# Patient Record
Sex: Male | Born: 1950 | Race: White | Hispanic: No | Marital: Married | Smoking: Never smoker
Health system: Southern US, Community
[De-identification: ages and names within clinical notes are randomized; demographics above are authoritative.]

## PROBLEM LIST (undated history)

## (undated) DIAGNOSIS — N529 Male erectile dysfunction, unspecified: Secondary | ICD-10-CM

## (undated) DIAGNOSIS — M1990 Unspecified osteoarthritis, unspecified site: Secondary | ICD-10-CM

## (undated) DIAGNOSIS — L719 Rosacea, unspecified: Secondary | ICD-10-CM

## (undated) DIAGNOSIS — M4802 Spinal stenosis, cervical region: Secondary | ICD-10-CM

## (undated) DIAGNOSIS — R3129 Other microscopic hematuria: Secondary | ICD-10-CM

## (undated) DIAGNOSIS — K469 Unspecified abdominal hernia without obstruction or gangrene: Secondary | ICD-10-CM

## (undated) DIAGNOSIS — E785 Hyperlipidemia, unspecified: Secondary | ICD-10-CM

## (undated) DIAGNOSIS — R9431 Abnormal electrocardiogram [ECG] [EKG]: Secondary | ICD-10-CM

## (undated) HISTORY — DX: Spinal stenosis, cervical region: M48.02

## (undated) HISTORY — DX: Rosacea, unspecified: L71.9

## (undated) HISTORY — DX: Other microscopic hematuria: R31.29

## (undated) HISTORY — DX: Abnormal electrocardiogram (ECG) (EKG): R94.31

## (undated) HISTORY — DX: Unspecified osteoarthritis, unspecified site: M19.90

## (undated) HISTORY — DX: Unspecified abdominal hernia without obstruction or gangrene: K46.9

## (undated) HISTORY — DX: Male erectile dysfunction, unspecified: N52.9

## (undated) HISTORY — PX: HERNIA REPAIR: SHX51

## (undated) HISTORY — DX: Hyperlipidemia, unspecified: E78.5

## (undated) HISTORY — PX: TONSILLECTOMY: SUR1361

---

## 1998-06-28 ENCOUNTER — Emergency Department (HOSPITAL_COMMUNITY): Admission: EM | Admit: 1998-06-28 | Discharge: 1998-06-28 | Payer: Self-pay | Admitting: Emergency Medicine

## 2004-06-29 ENCOUNTER — Encounter: Admission: RE | Admit: 2004-06-29 | Discharge: 2004-06-29 | Payer: Self-pay | Admitting: General Surgery

## 2004-07-01 ENCOUNTER — Ambulatory Visit (HOSPITAL_BASED_OUTPATIENT_CLINIC_OR_DEPARTMENT_OTHER): Admission: RE | Admit: 2004-07-01 | Discharge: 2004-07-01 | Payer: Self-pay | Admitting: General Surgery

## 2004-07-01 ENCOUNTER — Ambulatory Visit (HOSPITAL_COMMUNITY): Admission: RE | Admit: 2004-07-01 | Discharge: 2004-07-01 | Payer: Self-pay | Admitting: General Surgery

## 2004-07-02 HISTORY — PX: OTHER SURGICAL HISTORY: SHX169

## 2005-02-22 ENCOUNTER — Emergency Department (HOSPITAL_COMMUNITY): Admission: EM | Admit: 2005-02-22 | Discharge: 2005-02-22 | Payer: Self-pay | Admitting: Emergency Medicine

## 2008-06-04 ENCOUNTER — Encounter: Admission: RE | Admit: 2008-06-04 | Discharge: 2008-06-04 | Payer: Self-pay | Admitting: Family Medicine

## 2008-06-06 ENCOUNTER — Encounter: Admission: RE | Admit: 2008-06-06 | Discharge: 2008-06-06 | Payer: Self-pay | Admitting: Family Medicine

## 2009-02-08 HISTORY — PX: CERVICAL DISC SURGERY: SHX588

## 2009-02-21 ENCOUNTER — Ambulatory Visit (HOSPITAL_COMMUNITY): Admission: RE | Admit: 2009-02-21 | Discharge: 2009-02-21 | Payer: Self-pay | Admitting: Neurological Surgery

## 2009-02-21 ENCOUNTER — Encounter (INDEPENDENT_AMBULATORY_CARE_PROVIDER_SITE_OTHER): Payer: Self-pay | Admitting: Neurological Surgery

## 2009-11-24 ENCOUNTER — Ambulatory Visit (HOSPITAL_BASED_OUTPATIENT_CLINIC_OR_DEPARTMENT_OTHER): Admission: RE | Admit: 2009-11-24 | Discharge: 2009-11-24 | Payer: Self-pay | Admitting: General Surgery

## 2009-11-24 HISTORY — PX: OTHER SURGICAL HISTORY: SHX169

## 2010-04-26 LAB — CBC
HCT: 41.2 % (ref 39.0–52.0)
Hemoglobin: 14.7 g/dL (ref 13.0–17.0)
MCHC: 35.8 g/dL (ref 30.0–36.0)
MCV: 92.5 fL (ref 78.0–100.0)

## 2010-04-26 LAB — DIFFERENTIAL
Lymphocytes Relative: 26 % (ref 12–46)
Lymphs Abs: 1.3 10*3/uL (ref 0.7–4.0)
Monocytes Absolute: 0.4 10*3/uL (ref 0.1–1.0)
Monocytes Relative: 8 % (ref 3–12)

## 2010-04-26 LAB — BASIC METABOLIC PANEL
CO2: 28 mEq/L (ref 19–32)
Calcium: 9.1 mg/dL (ref 8.4–10.5)
Chloride: 103 mEq/L (ref 96–112)
Creatinine, Ser: 0.94 mg/dL (ref 0.4–1.5)
Glucose, Bld: 85 mg/dL (ref 70–99)
Sodium: 139 mEq/L (ref 135–145)

## 2010-04-26 LAB — PROTIME-INR: INR: 1.06 (ref 0.00–1.49)

## 2010-06-26 NOTE — Op Note (Signed)
NAME:  Jeffrey Browning, Jeffrey Browning                ACCOUNT NO.:  [id number]   MEDICAL RECORD NO.:  [id number]          PATIENT TYPE:  AMB   LOCATION:  DSC                          FACILITY:  [facility]   PHYSICIAN:  Leonie Man, M.D.   DATE OF BIRTH:  [birth year]   DATE OF PROCEDURE:  07/01/2004  DATE OF DISCHARGE:                                 OPERATIVE REPORT   PREOPERATIVE DIAGNOSIS:  Left inguinal hernia.   POSTOPERATIVE DIAGNOSIS:  Left direct inguinal hernia.   PROCEDURE:  Left inguinal herniorrhaphy with mesh (Parietex).   SURGEON:  Leonie Man, M.D.   ASSISTANT:  OR tech.   ANESTHESIA:  General.   SPECIMENS:  No specimens were sent to pathology.   ESTIMATED BLOOD LOSS:  Minimal.   COMPLICATIONS:  There were no complications and the patient was returned to  the PACU in excellent condition.   NOTE:  The patient is a 60 year old gentleman with a known left inguinal  hernia for about 8 or 9 years.  This has been growing rather large in the  last few months extending down into the left scrotum.  The hernia has  multiple loops of small bowel prolapsed into it.  This reduces fairly  easily. He comes to the operating room now after the risks and potential  benefits of surgery been fully discussed. All questions answered and consent  obtained.   PROCEDURE:  Following induction of satisfactory general anesthesia, the  patient positioned supinely. The abdomen was prepped and draped to be  included in a sterile operative field.  A left-sided groin incision is  carried down through skin, subcutaneous tissues through the external oblique  chondrosis with protection of the ilioinguinal nerve.  The very large  spermatic cord is then elevated and held with a Penrose drain. The very  large hernial sac is dissected free from the cord contents and this sac is  carried up to its origin which turned out to be medial to the inferior  epigastric vessels, making this a very large and prolapsing direct  inguinal  hernia. The entire hernia sac and its contents were prolapsed back into the  retroperitoneal space and the transversalis defect was closed with a onlay  patch of Parietex mesh which was sewn in at the pubic tubercle, carried up  along the conjoint tendon to the internal ring and again from the pubic  tubercle up along the shelving edge of Poupart's ligament to the internal  ring. The mesh was split so as to allow emanation of the cord. The tails of  the mesh was then sutured down behind the cord at the internal ring. All  areas of dissection then checked for hemostasis, noted to be dry. Sponge and  instrument counts were doubly verified and the wounds closed in layers as  follows. The external oblique chondrosis closed with  running 2-0 Vicryl suture. The Scarpa fascia closed with running 3-0 Vicryl  suture and skin closed with running 4-0 Monocryl suture.  It was then  reinforced with Steri-Strips. Sterile dressings applied. The anesthetic  reversed and the patient removed from the operating  room to the recovery  room in stable condition. He tolerated the procedure well.      PB/MEDQ  D:  07/01/2004  T:  07/01/2004  Job:  [id number]   cc:   Maryla Morrow. Modesto Charon, M.D.  [address]  [place]  [place] [address]  Fax: [phone]

## 2010-10-20 ENCOUNTER — Encounter (INDEPENDENT_AMBULATORY_CARE_PROVIDER_SITE_OTHER): Payer: Self-pay | Admitting: General Surgery

## 2010-10-21 ENCOUNTER — Encounter (INDEPENDENT_AMBULATORY_CARE_PROVIDER_SITE_OTHER): Payer: Self-pay | Admitting: General Surgery

## 2010-10-22 ENCOUNTER — Telehealth (INDEPENDENT_AMBULATORY_CARE_PROVIDER_SITE_OTHER): Payer: Self-pay | Admitting: General Surgery

## 2010-10-22 NOTE — Telephone Encounter (Signed)
Appointment moved up- will give message for Dr. Zachery Dakins to review. RMP

## 2010-10-30 ENCOUNTER — Encounter (INDEPENDENT_AMBULATORY_CARE_PROVIDER_SITE_OTHER): Payer: Self-pay | Admitting: General Surgery

## 2010-11-02 ENCOUNTER — Encounter (INDEPENDENT_AMBULATORY_CARE_PROVIDER_SITE_OTHER): Payer: Self-pay | Admitting: General Surgery

## 2010-11-02 ENCOUNTER — Ambulatory Visit (INDEPENDENT_AMBULATORY_CARE_PROVIDER_SITE_OTHER): Payer: Self-pay | Admitting: General Surgery

## 2010-11-02 VITALS — BP 122/76 | HR 80 | Temp 98.1°F | Resp 18 | Ht 72.0 in | Wt 217.4 lb

## 2010-11-02 DIAGNOSIS — R109 Unspecified abdominal pain: Secondary | ICD-10-CM

## 2010-11-02 DIAGNOSIS — R1030 Lower abdominal pain, unspecified: Secondary | ICD-10-CM

## 2010-11-02 NOTE — Patient Instructions (Signed)
Returned stool Hemoccult cards and try to see if there is any way that she can get included an insurance coverage. If you have no further episodes of pain and passage of a kidney stone is a good possibility. If you have an episode of pain at a time that I could examine you I am more likely to be able to identify the cause of your pain

## 2010-11-02 NOTE — Progress Notes (Signed)
Subjective:     Patient ID: Jeffrey Browning, male   DOB: [birth year], 60 y.o.   MRN: [id number]  HPIThe patient is a 60 year old male who approximately one year ago I repaired a right inguinal hernia with mesh an approximately 5 years ago he had a large left inguinal repair by Dr. Lurene Shadow. Recently he's had intermittent episodes of right groin pain and he describes as severe pain comes on all sudden last from 1-1/2 hours to 2-3 hours and then the pain is just go away.  He reported this to his PA Dr. Elvera Lennox  with the [facility] physician. He recommended that the patient see [place] thinking and he was having some scar tissue or possibly related to his hernia repair. The patient states that when these had these episodes of pain one time he could feel a definite ridge in the right incision area but he doesn't really describe a mass. The patient says the pain seems similar to when he had an episode of bowel obstruction before Dr. Lurene Shadow repaired the left inguinal hernia, but  this pain is on the right. The patient has had previous blood in his urine saw a urologist had a CT this was probably 4 years ago. They cystoscoped him but they never find an etiology for the blood at that time. He was not having these episodes of pain then. The pain that he describes could be a  right kidney stone and traveling toward his bladder.The pain is not related to any particular activities last from 2-3 hours to about 4 hours and then just goes away.  He has not noticed any blood in stool or abdominal distention or change in bowel habits. A urine check currently at [facility]  described a trace amount of blood in his urine. Dr. Janace Litten does not describe any tenderness or masses on his abdominal or groin exam when he saw the patient back in August.  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 His wife works for the state that he is not eligible  to be on her policies sense some dead line was passed.  The patient states he has no pain now and then last episode of pain was approximately 3 weeks ago.   Review of Systems Current Outpatient Prescriptions  Medication Sig Dispense Refill  . aspirin 81 MG tablet Take 81 mg by mouth daily.        Marland Kitchen doxycycline (ORACEA) 40 MG capsule Take 40 mg by mouth every morning.        . nabumetone (RELAFEN) 750 MG tablet Take 750 mg by mouth 2 (two) times daily.        . pravastatin (PRAVACHOL) 40 MG tablet Take 40 mg by mouth daily.        . sildenafil (VIAGRA) 100 MG tablet Take by mouth daily as needed.         Past Surgical History  Procedure Date  . Left ing hernia 07/02/2004    mesh (parietx)  . Right ing hernia 11/24/2009    mesh  . Tonsillectomy   . Hernia repair fall 2011  . Cervical disc surgery january 2011    repair   No Known Allergies Last colonoscopy approximately 4 years ago     Objective:   Physical ExamBP 122/76  Pulse 80  Temp 98.1 F (36.7 C)  Resp 18  Ht 6' (1.829 m)  [employer]  217 lb 6 oz (98.601 kg)  BMI 29.48 kg/m2  On physical examination at the the patient is in no discomfort and I examined his abdomen both groin and also a rectal examination ultrasound both the left and right and cannot appreciate a recurrent hernia on left or right still was very little stool in his rectum and the mucosa is Hemoccult negative the patient states he does not have pain radiating into his testicle when he has the episodes of pain   Assessment:    Recurrent episodes of right groin pain possibly a recurrent hernia which I cannot appreciate on physical ultrasound exam that the pain is described as fairly typical for a right kidney stone that could be passed in towards his bladder. I think we should first do a home Hemoccult and make sure that there is no blood in his stool and then I think a CT of the abdomen and pelvis with most likely determine whether he does have a recurrent hernia  does have a kidney stone or just what is going on.        Plan:    Returned a stool Hemoccult cards and see whether we need to proceed with a CAT scan. The patient ask whether to give him in 3 months extension of off work and I did not feel that I can justify that specially not knowing the etiology of his pain.

## 2010-11-10 ENCOUNTER — Encounter (INDEPENDENT_AMBULATORY_CARE_PROVIDER_SITE_OTHER): Payer: Self-pay | Admitting: General Surgery

## 2010-11-10 ENCOUNTER — Ambulatory Visit (INDEPENDENT_AMBULATORY_CARE_PROVIDER_SITE_OTHER): Payer: Self-pay | Admitting: Surgery

## 2010-11-10 VITALS — BP 138/78 | HR 84 | Temp 97.0°F | Resp 16 | Ht 72.0 in | Wt 217.8 lb

## 2010-11-10 DIAGNOSIS — K4091 Unilateral inguinal hernia, without obstruction or gangrene, recurrent: Secondary | ICD-10-CM

## 2010-11-11 ENCOUNTER — Encounter (INDEPENDENT_AMBULATORY_CARE_PROVIDER_SITE_OTHER): Payer: Self-pay | Admitting: Surgery

## 2010-11-11 ENCOUNTER — Other Ambulatory Visit (INDEPENDENT_AMBULATORY_CARE_PROVIDER_SITE_OTHER): Payer: Self-pay | Admitting: General Surgery

## 2010-11-11 ENCOUNTER — Telehealth (INDEPENDENT_AMBULATORY_CARE_PROVIDER_SITE_OTHER): Payer: Self-pay

## 2010-11-11 DIAGNOSIS — K4091 Unilateral inguinal hernia, without obstruction or gangrene, recurrent: Secondary | ICD-10-CM | POA: Insufficient documentation

## 2010-11-11 NOTE — Patient Instructions (Signed)
See the Handout(s) we gave you.  Consider surgery.  Please call our office at [phone] if you wish to schedule surgery or if you have further questions / concerns.

## 2010-11-11 NOTE — Progress Notes (Signed)
Subjective:     Patient ID: Jeffrey Browning, male   DOB: Apr 24, 1950, 60 y.o.   MRN: [id number]  HPI  Patient Care Team: Lolita Patella as PCP - General (Family Medicine)  This patient is a 60 y.o.male who presents today for surgical evaluation.   Patient noted groin pain. He saw Dr. Zachery Dakins. Initially, a hernia could not be detected. However the pain returned and he felt a mass. Dr. Zachery Dakins detected a hernia this time. He requested that I assume care of the patient for probable laparoscopic repair. The patient had an open repair of a right hernia as a child. He had a redo repair by Dr. Zachery Dakins with mesh on that side. He had a separate repair on the left at a different time. No pain on the left side.  Daily bowel movements. Good physical activity. No difficulty with urinary retention or prostate problems. No history of prior MRSA nor other skin infections  Past Medical History  Diagnosis Date  . Stenosis, cervical spine     left side tingling  . ED (erectile dysfunction)   . Hematuria, microscopic   . Rosacea   . Abnormal EKG     cardio lite stress test 2006 =normal Dr. Fraser Din  . Hyperlipidemia   . Hernia   . Arthritis   . Abdominal pain     Past Surgical History  Procedure Date  . Left ing hernia 07/02/2004    mesh (parietx)  . Right ing hernia 11/24/2009    open w mesh. [employer]  . Tonsillectomy   . Hernia repair fall 2011  . Cervical disc surgery january 2011    repair    History   Social History  . Marital Status: Married    Spouse Name: N/A    Number of Children: N/A  . Years of Education: N/A   Occupational History  . Not on file.   Social History Main Topics  . Smoking status: Never Smoker   . Smokeless tobacco: Never Used  . Alcohol Use: Yes  . Drug Use: No  . Sexually Active:    Other Topics Concern  . Not on file   Social History Narrative  . No narrative on file    No family history on file.  Current outpatient prescriptions:aspirin  81 MG tablet, Take 81 mg by mouth daily.  , Disp: , Rfl: ;  doxycycline (ORACEA) 40 MG capsule, Take 40 mg by mouth every morning.  , Disp: , Rfl: ;  nabumetone (RELAFEN) 750 MG tablet, Take 750 mg by mouth 2 (two) times daily.  , Disp: , Rfl: ;  pravastatin (PRAVACHOL) 40 MG tablet, Take 40 mg by mouth daily.  , Disp: , Rfl:  sildenafil (VIAGRA) 100 MG tablet, Take by mouth daily as needed.  , Disp: , Rfl:   No Known Allergies     Review of Systems  Constitutional: Negative for fever, chills and diaphoresis.  HENT: Negative for nosebleeds, sore throat, facial swelling, mouth sores, trouble swallowing and ear discharge.   Eyes: Negative for photophobia, discharge and visual disturbance.  Respiratory: Negative for choking, chest tightness, shortness of breath and stridor.   Cardiovascular: Negative for chest pain and palpitations.  Gastrointestinal: Negative for nausea, vomiting, abdominal pain, diarrhea, constipation, blood in stool, abdominal distention, anal bleeding and rectal pain.  Genitourinary: Negative for dysuria, urgency, hematuria, discharge, penile swelling, scrotal swelling, difficulty urinating, penile pain and testicular pain.  Musculoskeletal: Negative for myalgias, back pain, arthralgias and gait problem.  Skin: Negative for color change, pallor, rash and wound.  Neurological: Negative for dizziness, speech difficulty, weakness, numbness and headaches.  Hematological: Negative for adenopathy. Does not bruise/bleed easily.  Psychiatric/Behavioral: Negative for hallucinations, confusion and agitation.       Objective:   Physical Exam  Constitutional: He is oriented to person, place, and time. He appears well-developed and well-nourished. No distress.  HENT:  Head: Normocephalic.  Mouth/Throat: Oropharynx is clear and moist. No oropharyngeal exudate.  Eyes: Conjunctivae and EOM are normal. Pupils are equal, round, and reactive to light. No scleral icterus.  Neck: Normal  range of motion. Neck supple. No tracheal deviation present.  Cardiovascular: Normal rate, regular rhythm and intact distal pulses.   Pulmonary/Chest: Effort normal and breath sounds normal. No respiratory distress.  Abdominal: Soft. He exhibits no distension and no mass. There is no tenderness. Hernia confirmed negative in the left inguinal area.  Genitourinary: Penis normal.    No penile tenderness.  Musculoskeletal: Normal range of motion. He exhibits no tenderness.  Lymphadenopathy:    He has no cervical adenopathy.       Right: No inguinal adenopathy present.       Left: No inguinal adenopathy present.  Neurological: He is alert and oriented to person, place, and time. No cranial nerve deficit. He exhibits normal muscle tone. Coordination normal.  Skin: Skin is warm and dry. No rash noted. He is not diaphoretic. No erythema. No pallor.  Psychiatric: He has a normal mood and affect. His behavior is normal. Judgment and thought content normal.       Assessment:     Second recurrence of a right inguinal hernia in an overweight male.   Plan:     I think it makes sense to approach the repair from a different angle. He would be a good candidate for a laparoscopic approach. He is very interested in this.  The anatomy & physiology of the abdominal wall was discussed.  The pathophysiology of hernias was discussed.  Natural history risks without surgery of enlargement, pain, incarceration & strangulation was discussed.   Contributors to complications such as smoking, obesity, diabetes, prior surgery, etc were discussed.  I feel the risks of no intervention will lead to serious problems that outweigh the operative risks; therefore, I recommended surgery to reduce and repair the hernia.  I explained laparoscopic techniques with possible need for an open approach.  I noted the probable use of mesh to patch and/or buttress hernia repair  Risks such as bleeding, infection, abscess, need for  further treatment, heart attack, death, and other risks were discussed.  Goals of post-operative recovery were discussed as well.  Possibility that this will not correct all symptoms was explained.  I stressed the importance of low-impact activity, aggressive pain control, avoiding constipation, & not pushing through pain to minimize risk of post-operative chronic pain or injury. Possibility of reherniation was discussed.  We will work to minimize complications.   An educational handout further explaining the pathology & treatment options was given as well.  Questions were answered.  The patient expresses understanding & wishes to proceed with surgery.

## 2010-11-11 NOTE — Telephone Encounter (Signed)
Hemoccult x3 negative for blood. Tested on 11/11/2010

## 2010-11-13 ENCOUNTER — Encounter (INDEPENDENT_AMBULATORY_CARE_PROVIDER_SITE_OTHER): Payer: Self-pay | Admitting: General Surgery

## 2011-01-07 IMAGING — CR DG CERVICAL SPINE COMPLETE 4+V
6 series · 6 of 6 positions shown · non-contrast
Comparison: None.

CLINICAL DATA: Chronic neck and left shoulder pain.  No recent
injuries.

CERVICAL SPINE - COMPLETE 4+ VIEW 06/04/2008:

[view not recorded (1 of 6)]
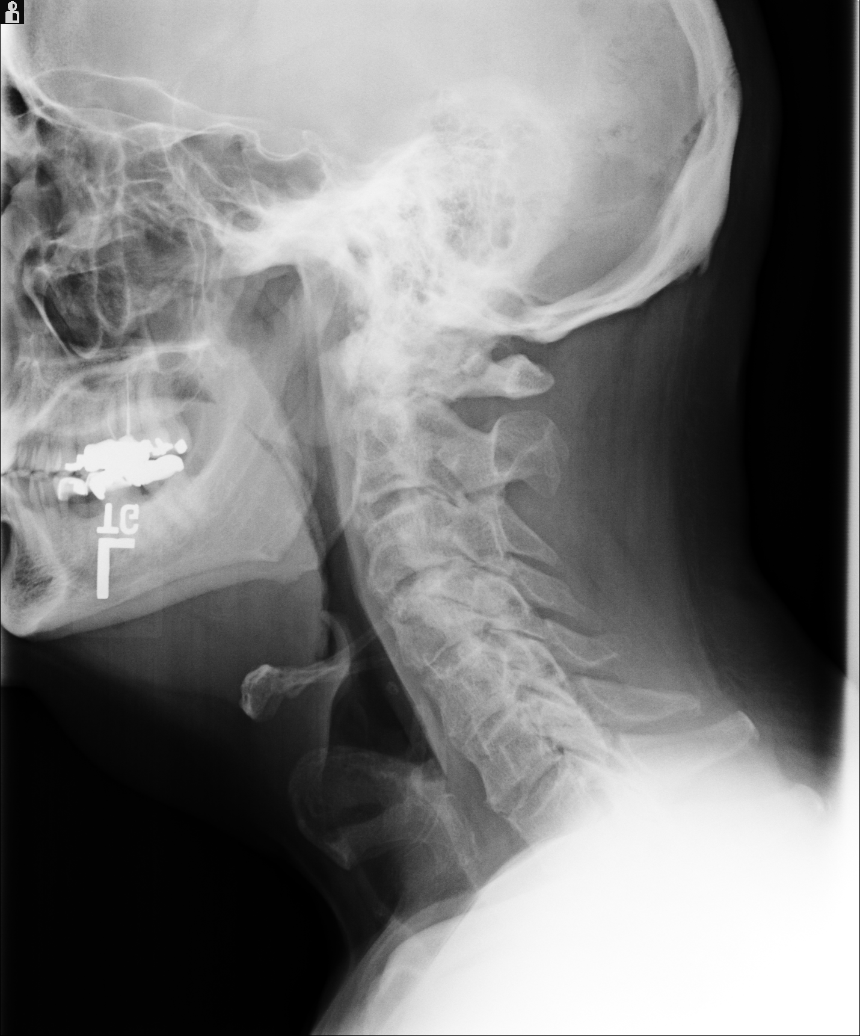

[view not recorded (2 of 6)]
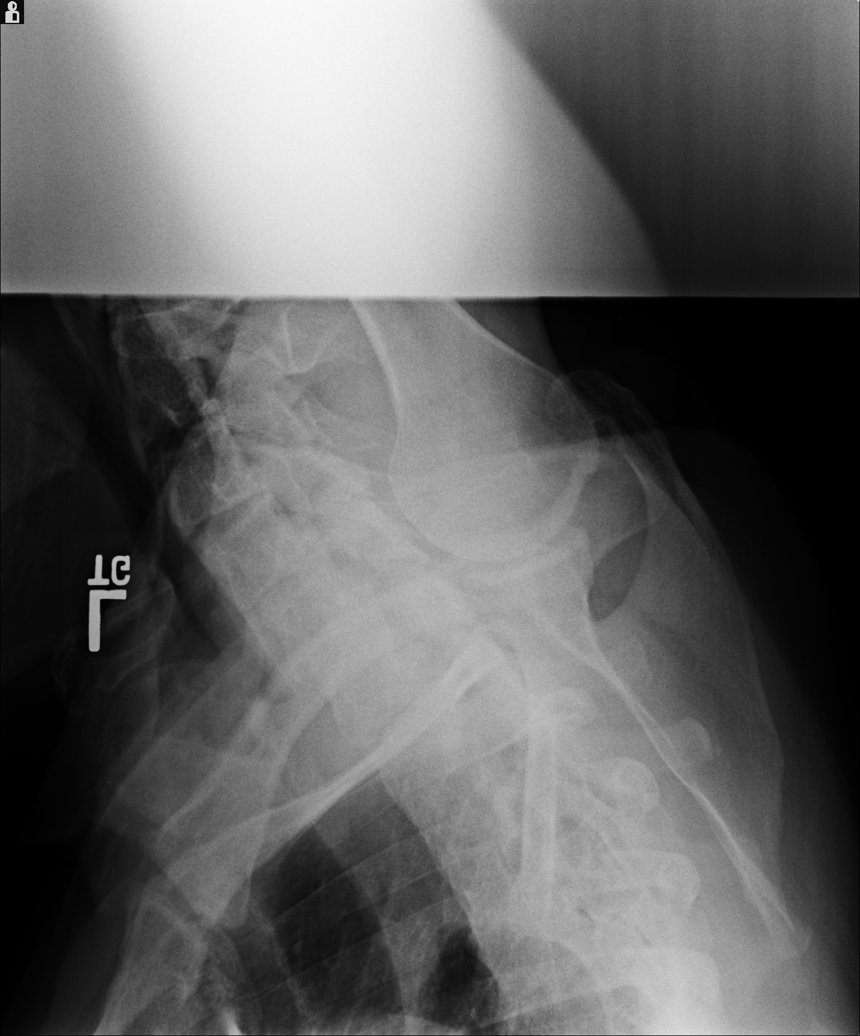

[view not recorded (3 of 6)]
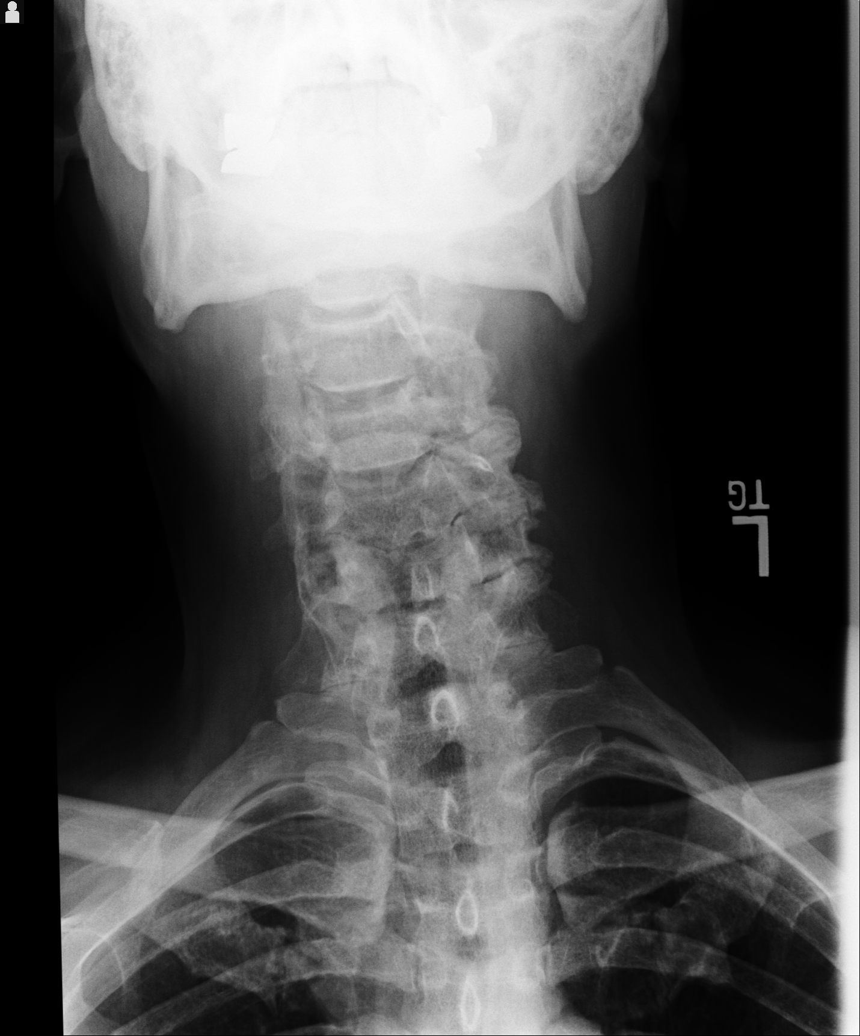

[view not recorded (4 of 6)]
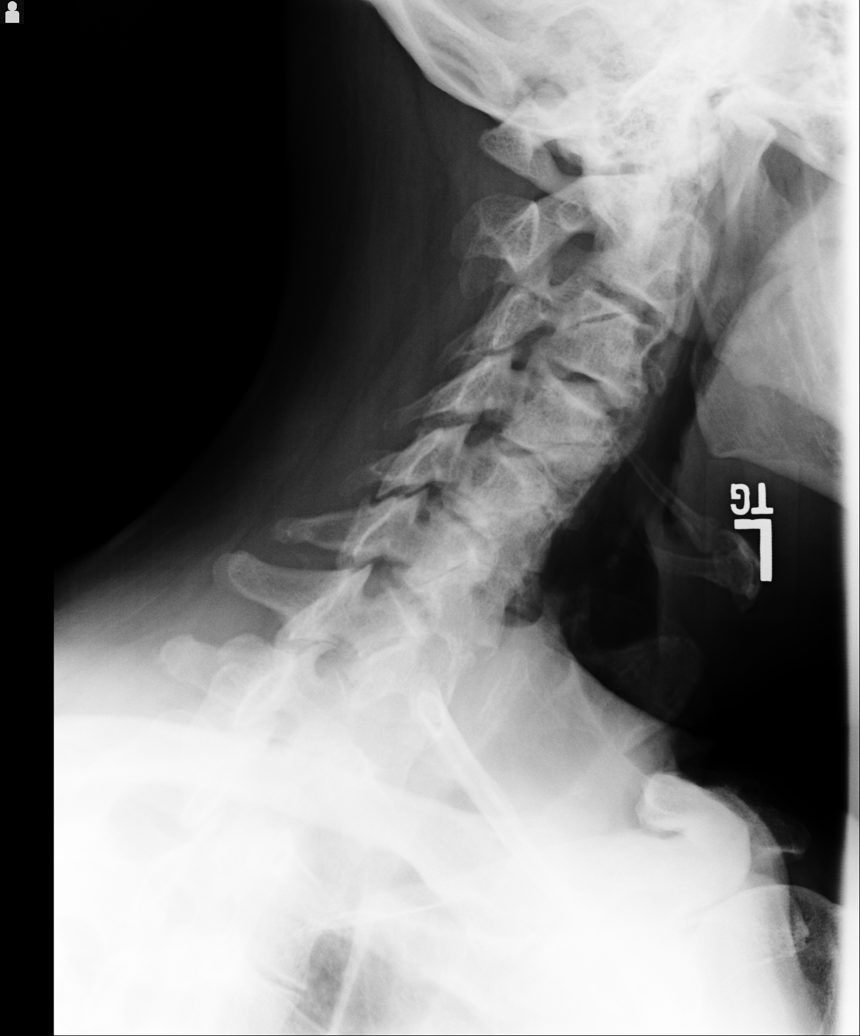

[view not recorded (5 of 6)]
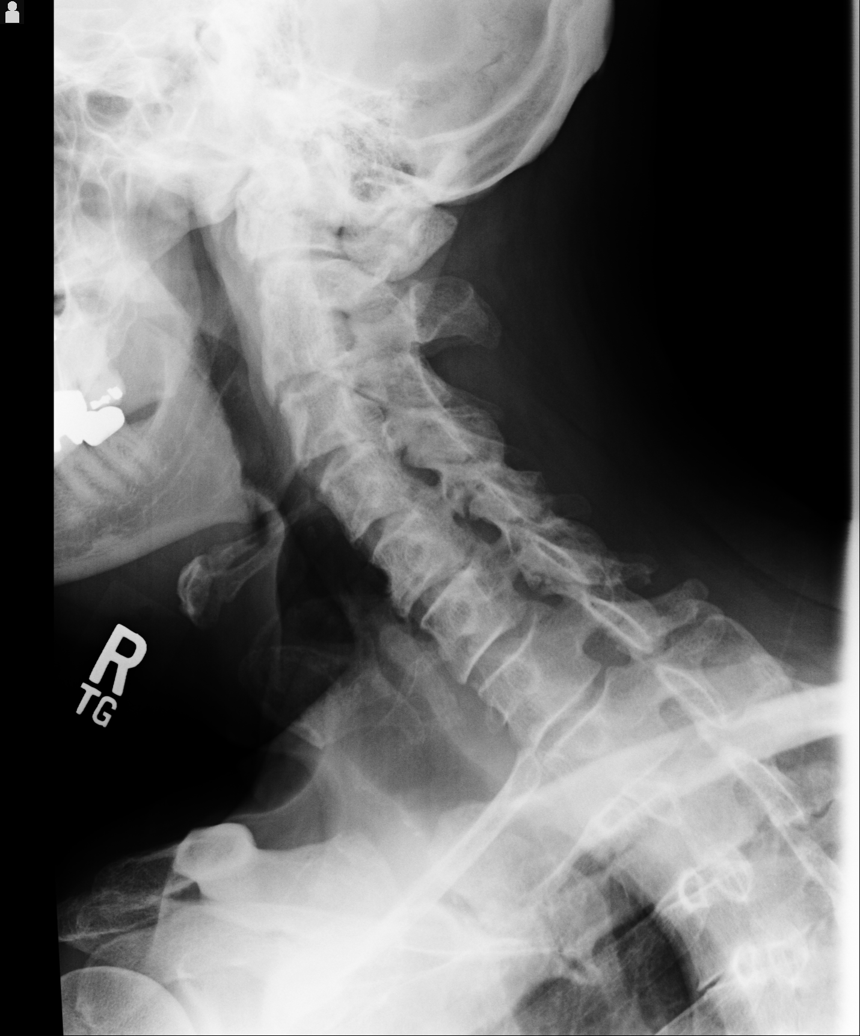

[view not recorded (6 of 6)]
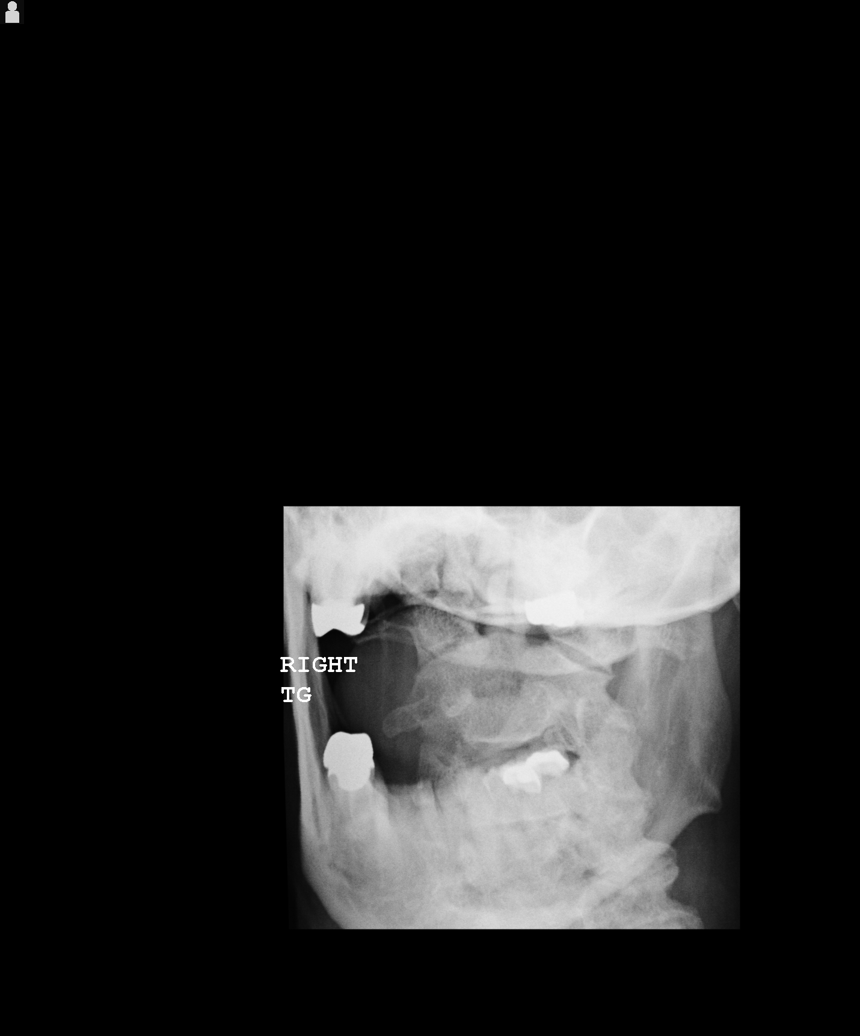

[6 of 6 positions shown; findings below may reference images not displayed]

FINDINGS: Anatomic alignment.  No visible fractures.  Disc space
narrowing and associated endplate hypertrophic changes at every
level from C3-4 through C6-7, worst at C5-6.  Diffuse facet
degenerative changes.  Normal prevertebral soft tissues.  Oblique
views demonstrate moderate to severe multilevel bilateral foraminal
stenoses.  No static evidence of instability.
IMPRESSION: Moderate to severe diffuse degenerative disc disease, spondylosis,
and facet degenerative changes with multilevel bilateral foraminal
stenoses.

## 2013-04-19 ENCOUNTER — Ambulatory Visit
Admission: RE | Admit: 2013-04-19 | Discharge: 2013-04-19 | Disposition: A | Discharge status: Home / Self Care | Payer: BC Managed Care – PPO | Source: Ambulatory Visit | Attending: Family Medicine | Admitting: Family Medicine

## 2013-04-19 ENCOUNTER — Other Ambulatory Visit: Payer: Self-pay | Admitting: Family Medicine

## 2013-04-19 DIAGNOSIS — M79661 Pain in right lower leg: Secondary | ICD-10-CM

## 2013-04-19 DIAGNOSIS — R609 Edema, unspecified: Secondary | ICD-10-CM

## 2014-02-18 ENCOUNTER — Ambulatory Visit (INDEPENDENT_AMBULATORY_CARE_PROVIDER_SITE_OTHER): Payer: Medicare Other

## 2014-02-18 VITALS — BP 134/84 | HR 88 | Resp 12

## 2014-02-18 DIAGNOSIS — M158 Other polyosteoarthritis: Secondary | ICD-10-CM

## 2014-02-18 DIAGNOSIS — L84 Corns and callosities: Secondary | ICD-10-CM | POA: Diagnosis not present

## 2014-02-18 DIAGNOSIS — M2040 Other hammer toe(s) (acquired), unspecified foot: Secondary | ICD-10-CM | POA: Diagnosis not present

## 2014-02-18 DIAGNOSIS — Q828 Other specified congenital malformations of skin: Secondary | ICD-10-CM | POA: Diagnosis not present

## 2014-02-18 NOTE — Patient Instructions (Signed)
Corns and Calluses Corns are small areas of thickened skin that usually occur on the top, sides, or tip of a toe. They contain a cone-shaped core with a point that can press on a nerve below. This causes pain. Calluses are areas of thickened skin that usually develop on hands, fingers, palms, soles of the feet, and heels. These are areas that experience frequent friction or pressure. CAUSES  Corns are usually the result of rubbing (friction) or pressure from shoes that are too tight or do not fit properly. Calluses are caused by repeated friction and pressure on the affected areas. SYMPTOMS  A hard growth on the skin.  Pain or tenderness under the skin.  Sometimes, redness and swelling.  Increased discomfort while wearing tight-fitting shoes. DIAGNOSIS  Your caregiver can usually tell what the problem is by doing a physical exam. TREATMENT  Removing the cause of the friction or pressure is usually the only treatment needed. However, sometimes medicines can be used to help soften the hardened, thickened areas. These medicines include salicylic acid plasters and 12% ammonium lactate lotion. These medicines should only be used under the direction of your caregiver. HOME CARE INSTRUCTIONS   Try to remove pressure from the affected area.  You may wear donut-shaped corn pads to protect your skin.  You may use a pumice stone or nonmetallic nail file to gently reduce the thickness of a corn.  Wear properly fitted footwear.  If you have calluses on the hands, wear gloves during activities that cause friction.  If you have diabetes, you should regularly examine your feet. Tell your caregiver if you notice any problems with your feet. SEEK IMMEDIATE MEDICAL CARE IF:   You have increased pain, swelling, redness, or warmth in the affected area.  Your corn or callus starts to drain fluid or bleeds.  You are not getting better, even with treatment. Document Released: 11/01/2003 Document  Revised: 04/19/2011 Document Reviewed: 09/22/2010 [facility] LLCExitCare Patient Information 2015 [employer], [place]. This information is not intended to replace advice given to you by your health care provider. Make sure you discuss any questions you have with your health care provider.    Because of the loss of fat pad good recommendation at this time would be gel insoles or memory foam type insoles to help provide and replace the cushion that has been lost on the bottom of the foot

## 2014-02-18 NOTE — Progress Notes (Signed)
   Subjective:    Patient ID: Jeffrey Browning, male    DOB: 03/21/1950, 64 y.o.   MRN: [id number]  HPI  PT STATED  STATED LT BALL OF THE FOOT HAVE CALLUS AND BEEN HURTING/BURNING FOR 25 YEARS. PT BEEN SEEN IN [place] FOR THE SAME PROBLEM. FOOT IS GETTING WORSE AND GET AGGRAVATED BY PRESSURE. TRIED TO TRIM IT DOWN AND INSERTS BUT NO HELP.   Review of Systems  Cardiovascular: Positive for leg swelling.  All other systems reviewed and are negative.      Objective:   Physical Exam 64 year old white male well-developed well-nourished oriented 3 presents this time with recurrence of keratosis plantar aspect left foot more so than right sub-second third and fourth metatarsals has multiple DP keratotic lesions been going on for the past 25 years. Objective findings vascular status is intact with pedal pulses palpable DP and PT +2 over 4 Refill time 3 seconds epicritic and proprioceptive sensations intact and symmetric bilateral normal plantar response DTRs not listed dermatologic the skin color pigment normal hair growth absent nails criptotic there is atrophy of fat pad plantarly with a keratotic lesion subsecond sub-third is most painful tender symptomatic into lesion sub-fourth and noted on left foot the single lesion sub-second on right is identified as well. Once limitus rigidus deformity bilateral with some arthropathy the forefoot there is some swelling in arthropathy the left ankle is well patient digit general atrophy of fat pad was rigid contractures of the toes are identified.       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 Plan at this time patient had been a candidate for surgery years ago which to avoid that at this time I do feel cushioned insoles and passively beneficial keratotic lesions debrided at this time there is no secondary  infection no ascending size lymphangitis recommended a memory foam or gel insoles follow-up as needed for future palliative care maintain a cushioned accommodative shoe at all times in the future surgical intervention might be considered just that is a last resort  Alvan Dameichard Zailyn Thoennes DPM

## 2017-12-19 DIAGNOSIS — Z01818 Encounter for other preprocedural examination: Secondary | ICD-10-CM

## 2020-06-04 DIAGNOSIS — Z125 Encounter for screening for malignant neoplasm of prostate: Secondary | ICD-10-CM | POA: Diagnosis not present

## 2020-06-04 DIAGNOSIS — E782 Mixed hyperlipidemia: Secondary | ICD-10-CM | POA: Diagnosis not present

## 2020-06-04 DIAGNOSIS — I868 Varicose veins of other specified sites: Secondary | ICD-10-CM | POA: Diagnosis not present

## 2020-06-04 DIAGNOSIS — Z1389 Encounter for screening for other disorder: Secondary | ICD-10-CM | POA: Diagnosis not present

## 2020-06-04 DIAGNOSIS — Z0000 Encounter for general adult medical examination without abnormal findings: Secondary | ICD-10-CM | POA: Diagnosis not present

## 2020-06-04 DIAGNOSIS — M545 Low back pain, unspecified: Secondary | ICD-10-CM | POA: Diagnosis not present

## 2020-06-04 DIAGNOSIS — L719 Rosacea, unspecified: Secondary | ICD-10-CM | POA: Diagnosis not present

## 2020-06-04 DIAGNOSIS — R609 Edema, unspecified: Secondary | ICD-10-CM | POA: Diagnosis not present

## 2020-06-04 DIAGNOSIS — N529 Male erectile dysfunction, unspecified: Secondary | ICD-10-CM | POA: Diagnosis not present

## 2021-06-16 DIAGNOSIS — L821 Other seborrheic keratosis: Secondary | ICD-10-CM | POA: Diagnosis not present

## 2021-06-16 DIAGNOSIS — L820 Inflamed seborrheic keratosis: Secondary | ICD-10-CM | POA: Diagnosis not present

## 2021-06-16 DIAGNOSIS — L578 Other skin changes due to chronic exposure to nonionizing radiation: Secondary | ICD-10-CM | POA: Diagnosis not present

## 2021-06-16 DIAGNOSIS — C44311 Basal cell carcinoma of skin of nose: Secondary | ICD-10-CM | POA: Diagnosis not present

## 2021-06-29 DIAGNOSIS — L719 Rosacea, unspecified: Secondary | ICD-10-CM | POA: Diagnosis not present

## 2021-06-29 DIAGNOSIS — I868 Varicose veins of other specified sites: Secondary | ICD-10-CM | POA: Diagnosis not present

## 2021-06-29 DIAGNOSIS — Z0000 Encounter for general adult medical examination without abnormal findings: Secondary | ICD-10-CM | POA: Diagnosis not present

## 2021-06-29 DIAGNOSIS — N529 Male erectile dysfunction, unspecified: Secondary | ICD-10-CM | POA: Diagnosis not present

## 2021-06-29 DIAGNOSIS — K5790 Diverticulosis of intestine, part unspecified, without perforation or abscess without bleeding: Secondary | ICD-10-CM | POA: Diagnosis not present

## 2021-06-29 DIAGNOSIS — R609 Edema, unspecified: Secondary | ICD-10-CM | POA: Diagnosis not present

## 2021-06-29 DIAGNOSIS — Z1331 Encounter for screening for depression: Secondary | ICD-10-CM | POA: Diagnosis not present

## 2021-06-29 DIAGNOSIS — M545 Low back pain, unspecified: Secondary | ICD-10-CM | POA: Diagnosis not present

## 2021-06-29 DIAGNOSIS — Z125 Encounter for screening for malignant neoplasm of prostate: Secondary | ICD-10-CM | POA: Diagnosis not present

## 2021-06-29 DIAGNOSIS — E782 Mixed hyperlipidemia: Secondary | ICD-10-CM | POA: Diagnosis not present

## 2021-08-31 DIAGNOSIS — Z86010 Personal history of colonic polyps: Secondary | ICD-10-CM | POA: Diagnosis not present

## 2021-08-31 DIAGNOSIS — Z1211 Encounter for screening for malignant neoplasm of colon: Secondary | ICD-10-CM | POA: Diagnosis not present

## 2021-08-31 DIAGNOSIS — K5730 Diverticulosis of large intestine without perforation or abscess without bleeding: Secondary | ICD-10-CM | POA: Diagnosis not present

## 2022-03-05 DIAGNOSIS — Z96651 Presence of right artificial knee joint: Secondary | ICD-10-CM | POA: Diagnosis not present

## 2022-03-05 DIAGNOSIS — M25561 Pain in right knee: Secondary | ICD-10-CM | POA: Diagnosis not present

## 2022-07-06 DIAGNOSIS — E782 Mixed hyperlipidemia: Secondary | ICD-10-CM | POA: Diagnosis not present

## 2022-07-06 DIAGNOSIS — R609 Edema, unspecified: Secondary | ICD-10-CM | POA: Diagnosis not present

## 2022-07-06 DIAGNOSIS — Z0000 Encounter for general adult medical examination without abnormal findings: Secondary | ICD-10-CM | POA: Diagnosis not present

## 2022-07-06 DIAGNOSIS — Z125 Encounter for screening for malignant neoplasm of prostate: Secondary | ICD-10-CM | POA: Diagnosis not present

## 2022-07-06 DIAGNOSIS — N529 Male erectile dysfunction, unspecified: Secondary | ICD-10-CM | POA: Diagnosis not present

## 2022-07-06 DIAGNOSIS — L719 Rosacea, unspecified: Secondary | ICD-10-CM | POA: Diagnosis not present

## 2022-07-06 DIAGNOSIS — Z23 Encounter for immunization: Secondary | ICD-10-CM | POA: Diagnosis not present

## 2022-07-06 DIAGNOSIS — M545 Low back pain, unspecified: Secondary | ICD-10-CM | POA: Diagnosis not present

## 2022-07-06 DIAGNOSIS — R7301 Impaired fasting glucose: Secondary | ICD-10-CM | POA: Diagnosis not present

## 2022-08-13 ENCOUNTER — Other Ambulatory Visit: Payer: Self-pay | Admitting: *Deleted

## 2022-08-13 DIAGNOSIS — M79606 Pain in leg, unspecified: Secondary | ICD-10-CM

## 2022-08-25 ENCOUNTER — Ambulatory Visit: Payer: Medicare PPO | Admitting: Physician Assistant

## 2022-08-25 ENCOUNTER — Ambulatory Visit (HOSPITAL_COMMUNITY)
Admission: RE | Admit: 2022-08-25 | Discharge: 2022-08-25 | Disposition: A | Discharge status: Home / Self Care | Payer: Medicare PPO | Source: Ambulatory Visit | Attending: Vascular Surgery | Admitting: Vascular Surgery

## 2022-08-25 VITALS — BP 118/75 | HR 97 | Temp 97.6°F | Wt 211.0 lb

## 2022-08-25 DIAGNOSIS — I872 Venous insufficiency (chronic) (peripheral): Secondary | ICD-10-CM

## 2022-08-25 DIAGNOSIS — M79605 Pain in left leg: Secondary | ICD-10-CM

## 2022-08-25 DIAGNOSIS — I8393 Asymptomatic varicose veins of bilateral lower extremities: Secondary | ICD-10-CM

## 2022-08-25 DIAGNOSIS — I8002 Phlebitis and thrombophlebitis of superficial vessels of left lower extremity: Secondary | ICD-10-CM

## 2022-08-25 DIAGNOSIS — M79606 Pain in leg, unspecified: Secondary | ICD-10-CM | POA: Insufficient documentation

## 2022-08-25 NOTE — Progress Notes (Signed)
Requested by:  Laurann Montana, MD [phone] [address] [place],  [place] [address]  Reason for consultation: symptomatic varicose veins    History of Present Illness   Jeffrey Browning is a 72 y.o. (05/25/1950) male who presents for evaluation of bilateral varicose veins and swelling, left leg > right. He says this has been progressing for approximately 10 years. He initially had a lot of trouble with his right leg and he was evaluated and found to have superficial vein thrombosis almost 10 years ago. He also was told he had bakers cyst and needed a knee replacement. He since had his knee surgery and no longer has any problems with his right leg. His left leg now has had increased swelling especially around the ankle. He denies any aching, throbbing, stinging, burning, itching, bleeding or ulceration. He elevates a little in a recliner but he says he does not regularly elevate his legs. He does report that his swelling is resolved upon first waking.  He has tried OTC knee high compression stockings but felt that they did not help. He also takes Furosemide which he says maybe helps a little. He has always been very active for work prior to retiring and he says even now he exercises regularly. He explains that before his knee replacement he walked 5 miles a day. He now does walk as regularly. He denies any history of DVT. He does have possibly some family history of venous disease.   Venous symptoms include: swelling, visible varicose veins Onset/duration:  > 10 years  Occupation:  retired Aggravating factors: sitting, standing Alleviating factors: elevation Compression:  yes, OTC Helps:  no Pain medications:  no Previous vein procedures:  no History of DVT:  no  Past Medical History:  Diagnosis Date   Abdominal pain    Abnormal EKG    cardio lite stress test 2006 =normal Dr. Fraser Din   Arthritis    ED (erectile dysfunction)    Hematuria, microscopic    Hernia    Hyperlipidemia     Rosacea    Stenosis, cervical spine    left side tingling    Past Surgical History:  Procedure Laterality Date   CERVICAL DISC SURGERY  january 2011   repair   HERNIA REPAIR  fall 2011   left ing hernia  07/02/2004   mesh (parietx)   right ing hernia  11/24/2009   open w mesh. [employer]   TONSILLECTOMY      Social History   Socioeconomic History   Marital status: Married    Spouse name: Not on file   Number of children: Not on file   Years of education: Not on file   Highest education level: Not on file  Occupational History   Not on file  Tobacco Use   Smoking status: Never   Smokeless tobacco: Never  Substance and Sexual Activity   Alcohol use: Yes   Drug use: No   Sexual activity: Not on file  Other Topics Concern   Not on file  Social History Narrative   Not on file   Social Determinants of Health   Financial Resource Strain: Not on file  Food Insecurity: Not on file  Transportation Needs: Not on file  Physical Activity: Not on file  Stress: Not on file  Social Connections: Not on file  Intimate Partner Violence: Not on file   No family history on file.  Current Outpatient Medications  Medication Sig Dispense Refill   aspirin 81  MG tablet Take 81 mg by mouth daily.       doxycycline (ORACEA) 40 MG capsule Take 40 mg by mouth every morning.       furosemide (LASIX) 40 MG tablet Take 40 mg by mouth daily.     nabumetone (RELAFEN) 750 MG tablet Take 750 mg by mouth 2 (two) times daily.       pravastatin (PRAVACHOL) 40 MG tablet Take 40 mg by mouth daily.       sildenafil (VIAGRA) 100 MG tablet Take by mouth daily as needed.       No current facility-administered medications for this visit.    No Known Allergies  REVIEW OF SYSTEMS (negative unless checked):   Cardiac:  []  Chest pain or chest pressure? []  Shortness of breath upon activity? []  Shortness of breath when lying flat? []  Irregular heart rhythm?  Vascular:  []  Pain in calf, thigh, or hip  brought on by walking? []  Pain in feet at night that wakes you up from your sleep? [x]  Blood clot in your veins? [x]  Leg swelling?  Pulmonary:  []  Oxygen at home? []  Productive cough? []  Wheezing?  Neurologic:  []  Sudden weakness in arms or legs? []  Sudden numbness in arms or legs? []  Sudden onset of difficult speaking or slurred speech? []  Temporary loss of vision in one eye? []  Problems with dizziness?  Gastrointestinal:  []  Blood in stool? []  Vomited blood?  Genitourinary:  []  Burning when urinating? []  Blood in urine?  Psychiatric:  []  Major depression  Hematologic:  []  Bleeding problems? []  Problems with blood clotting?  Dermatologic:  []  Rashes or ulcers?  Constitutional:  []  Fever or chills?  Ear/Nose/Throat:  []  Change in hearing? []  Nose bleeds? []  Sore throat?  Musculoskeletal:  []  Back pain? []  Joint pain? []  Muscle pain?   Physical Examination     Vitals:   08/25/22 1210  BP: 118/75  Pulse: 97  Temp: 97.6 F (36.4 C)  TempSrc: Temporal  SpO2: 91%  Weight: 211 lb (95.7 kg)   There is no height or weight on file to calculate BMI.  General:  WDWN in NAD; vital signs documented above Gait: Normal HENT: WNL, normocephalic Pulmonary: normal non-labored breathing , without wheezing Cardiac: regular HR Abdomen: soft Vascular Exam/Pulses: 2+ popliteal, Dp and PT pulses Extremities: with varicose veins left leg > right, with reticular veins bilateral lower extremities, with edema left leg and ankle > right, with stasis pigmentation, without lipodermatosclerosis, without ulcers Musculoskeletal: no muscle wasting or atrophy  Neurologic: A&O X 3;  No focal weakness or paresthesias are detected Psychiatric:  The pt has Normal affect.  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, Non occlusive thrombus in posterior accessory saphenous vein GSV  reflux SFJ, Distal thigh, Knee and proximal calf GSV diameter 0.35-1.34 cm No SSV reflux  CFV, FV, Popliteal deep venous reflux  +-------------+---------+------+---------+------------+--------------------  ----+  LEFT        Reflux NoReflux Reflux  Diameter cmsComments                                          Yes    Time                                          +-------------+---------+------+---------+------------+--------------------  ----+  CFV                   yes  >1 second                                       +-------------+---------+------+---------+------------+--------------------  ----+  FV prox      no                                                             +-------------+---------+------+---------+------------+--------------------  ----+  FV mid       no                                                             +-------------+---------+------+---------+------------+--------------------  ----+  FV dist                yes  >1 second                                       +-------------+---------+------+---------+------------+--------------------  ----+  Popliteal             yes  >1 second                                       +-------------+---------+------+---------+------------+--------------------  ----+  GSV at [facility]             yes   >500 ms     1.14                               +-------------+---------+------+---------+------------+--------------------  ----+  GSV prox               yes   >500 ms     1.34    Posterior accessory        thigh                                            branch                     +-------------+---------+------+---------+------------+--------------------  ----+  GSV mid thighno                          0.35    branching                  +-------------+---------+------+---------+------------+--------------------  ----+  GSV dist     no               >500 ms     0.85    branching  thigh                                                                       +-------------+---------+------+---------+------------+--------------------  ----+  GSV at knee            yes   >500 ms     0.78    chronic thrombus           +-------------+---------+------+---------+------------+--------------------  ----+  GSV prox calf          yes   >500 ms     0.81    chronic thrombus           +-------------+---------+------+---------+------------+--------------------  ----+  SSV Pop Fossano                          0.28                               +-------------+---------+------+---------+------------+--------------------  ----+  SSV prox calfno                          0.44                               +-------------+---------+------+---------+------------+--------------------  ----+  PASV Prox              yes   >500 ms     0.69                               +-------------+---------+------+---------+------------+--------------------  ----+  PASV Mid               yes   >500 ms     0.56                               +-------------+---------+------+---------+------------+--------------------  ----+  PASV distal            yes   >500 ms     1.0     chronic thrombus           +-------------+---------+------+---------+------------+--------------------  ----+       Medical Decision Making   Jeffrey Browning is a 72 y.o. male who presents with: LLE chronic venous insufficiency with varicose veins and swelling. Duplex today shows no DVT. He does have chronic thrombus in his distal GSV as well as a posterior accessory vein. He has Deep reflux in the CFV, FV and popliteal vein. He also has superficial venous reflux in the GSV with multiple branches. No SSV reflux. His GSV is very large based on today's study. I discussed with patient that while he could be a candidate for  ablation I am not sure how much benefit it would provide him in improving his symptoms since he has deep reflux as well. Also based on his superficial thrombus and multiple branches he may not be a candidate. He would like to follow up to see if he could be considered for this. I  dont think it is unreasonable so I will have him return to meet with one of the vascular surgeons for further evaluation.  Based on the patient's history and examination, I recommend: daily elevation above level of heart, thigh high compression stockings, exercise, refraining from prolonged sitting or standing I discussed with the patient the use of his 20-30 mm thigh high compression stockings and need for 3 month trial of such. The patient will follow up in 3 months with vascular surgeon for further evaluation of LLE for venous ablation  Thank you for allowing [place] to participate in this patient's care.   Graceann Congress, PA-C [facility] Office: [phone]  08/25/2022, 12:51 PM  Clinic MD: Randie Heinz

## 2022-12-20 ENCOUNTER — Ambulatory Visit: Payer: Medicare PPO | Admitting: Surgery

## 2022-12-20 ENCOUNTER — Encounter: Payer: Self-pay | Admitting: Surgery

## 2022-12-20 VITALS — BP 128/75 | HR 76 | Temp 98.5°F | Resp 20 | Ht 72.0 in | Wt 208.0 lb

## 2022-12-20 DIAGNOSIS — I83813 Varicose veins of bilateral lower extremities with pain: Secondary | ICD-10-CM

## 2022-12-20 NOTE — Progress Notes (Signed)
[facility]  Patient name: Jeffrey Browning MRN: [id number] DOB: 05/28/1950 Sex: male   REASON FOR VISIT:    Follow up  HISOTRY OF PRESENT ILLNESS:    Jeffrey Browning is a 72 y.o. male who returns today for follow-up of his varicose veins.  He was initially seen in the [facility] in July 2024.  He has been complaining of leg swelling for the past 10 years that has gotten worse.  He has a history of a superficial thrombophlebitis in the right leg.  His swelling is worse at the end of the day.  Elevation will help.  He does complain of itching burning and throbbing.  He does not have any history of bleeding.  He has been compliant with 20-30 thigh-high compression.  There is no DVT.  He is back today for follow-up.  He states that he is still having issues with his left leg particularly at the end of the day.  His itching has gotten worse.  The compression socks did help somewhat.   PAST MEDICAL HISTORY:   Past Medical History:  Diagnosis Date   Abdominal pain    Abnormal EKG    cardio lite stress test 2006 =normal Dr. Fraser Din   Arthritis    ED (erectile dysfunction)    Hematuria, microscopic    Hernia    Hyperlipidemia    Rosacea    Stenosis, cervical spine    left side tingling     FAMILY HISTORY:   History reviewed. No pertinent family history.  SOCIAL HISTORY:   Social History   Tobacco Use   Smoking status: Never   Smokeless tobacco: Never  Substance Use Topics   Alcohol use: Yes     ALLERGIES:   No Known Allergies   CURRENT MEDICATIONS:   Current Outpatient Medications  Medication Sig Dispense Refill   aspirin 81 MG tablet Take 81 mg by mouth daily.       doxycycline (ORACEA) 40 MG capsule Take 40 mg by mouth every morning.       furosemide (LASIX) 40 MG tablet Take 40 mg by mouth daily.     nabumetone (RELAFEN) 750 MG tablet Take 750 mg by mouth 2 (two) times daily.       pravastatin  (PRAVACHOL) 40 MG tablet Take 40 mg by mouth daily.       sildenafil (VIAGRA) 100 MG tablet Take by mouth daily as needed.       No current facility-administered medications for this visit.    REVIEW OF SYSTEMS:   [X]  denotes positive finding, [ ]  denotes negative finding Cardiac  Comments:  Chest pain or chest pressure:    Shortness of breath upon exertion:    Short of breath when lying flat:    Irregular heart rhythm:        Vascular    Pain in calf, thigh, or hip brought on by ambulation:    Pain in feet at night that wakes you up from your sleep:     Blood clot in your veins:    Leg swelling:  x       Pulmonary    Oxygen at home:    Productive cough:     Wheezing:         Neurologic    Sudden weakness in arms or legs:     Sudden numbness in arms or legs:     Sudden onset of difficulty speaking or slurred speech:  Temporary loss of vision in one eye:     Problems with dizziness:         Gastrointestinal    Blood in stool:     Vomited blood:         Genitourinary    Burning when urinating:     Blood in urine:        Psychiatric    Major depression:         Hematologic    Bleeding problems:    Problems with blood clotting too easily:        Skin    Rashes or ulcers:        Constitutional    Fever or chills:      PHYSICAL EXAM:   There were no vitals filed for this visit.  GENERAL: The patient is a well-nourished male, in no acute distress. The vital signs are documented above. CARDIAC: There is a regular rate and rhythm.  VASCULAR: SonoSite was used to evaluate the saphenous vein.  I had read a comment and verify this with me.  I feel that he has a dilated saphenous vein which becomes extrafascial in the mid to upper thigh.  There is a large anterior accessory vein.  There are numerous varicosities in the medial calf PULMONARY: Non-labored respirations MUSCULOSKELETAL: There are no major deformities or cyanosis. NEUROLOGIC: No focal weakness or  paresthesias are detected. SKIN: See photo below PSYCHIATRIC: The patient has a normal affect.  STUDIES:   I have reviewed the following reflux study: +-------------+---------+------+---------+------------+--------------------  ----+  LEFT        Reflux NoReflux Reflux  Diameter cmsComments                                          Yes    Time                                          +-------------+---------+------+---------+------------+--------------------  ----+  CFV                   yes  >1 second                                       +-------------+---------+------+---------+------------+--------------------  ----+  FV prox      no                                                             +-------------+---------+------+---------+------------+--------------------  ----+  FV mid       no                                                             +-------------+---------+------+---------+------------+--------------------  ----+  FV dist                yes  >  1 second                                       +-------------+---------+------+---------+------------+--------------------  ----+  Popliteal             yes  >1 second                                       +-------------+---------+------+---------+------------+--------------------  ----+  GSV at [facility]             yes   >500 ms     1.14                               +-------------+---------+------+---------+------------+--------------------  ----+  GSV prox               yes   >500 ms     1.34    Posterior accessory        thigh                                            branch                     +-------------+---------+------+---------+------------+--------------------  ----+  GSV mid thighno                          0.35    branching                  +-------------+---------+------+---------+------------+--------------------  ----+   GSV dist     no              >500 ms     0.85    branching                  thigh                                                                       +-------------+---------+------+---------+------------+--------------------  ----+  GSV at knee            yes   >500 ms     0.78    chronic thrombus           +-------------+---------+------+---------+------------+--------------------  ----+  GSV prox calf          yes   >500 ms     0.81    chronic thrombus           +-------------+---------+------+---------+------------+--------------------  ----+  SSV Pop Fossano                          0.28                               +-------------+---------+------+---------+------------+--------------------  ----+  SSV prox calfno  0.44                               +-------------+---------+------+---------+------------+--------------------  ----+  PASV Prox              yes   >500 ms     0.69                               +-------------+---------+------+---------+------------+--------------------  ----+  PASV Mid               yes   >500 ms     0.56                               +-------------+---------+------+---------+------------+--------------------  ----+  PASV distal            yes   >500 ms     1.0     chronic thrombus           +-------------+---------+------+---------+------------+--------------------  ----+          MEDICAL ISSUES:   CEAP class III: The patient having significant issues with his left leg in regards to swelling and itching.  His symptoms are worse at the end of the day.  The compression socks did help.  I looked at his saphenous vein with ultrasound.  I do not see the large posterior branch as visualized on the initial ultrasound.  I had read a common and examined him with me.  I feel he has a large saphenous vein that becomes extrafascial in the mid thigh.  There is  also an anterior accessory branch which is dilated.  There are numerous varicosities around the medial calf.  I would propose laser ablation of the saphenous vein beginning in the mid to upper thigh.  I would then like to perform stab phlebectomy to remove the extrafascial portion of the saphenous vein and possibly the extrafascial anterior accessory vein.  I would then proceed with stab phlebectomy of the varicosities in the medial calf.  Patient would like to get this taken care of.  We will work on Therapist, occupational and get him approved.    Charlena Cross, MD, FACS [facility] [phone] Pager [phone]

## 2022-12-22 ENCOUNTER — Other Ambulatory Visit: Payer: Self-pay | Admitting: *Deleted

## 2022-12-22 DIAGNOSIS — I83812 Varicose veins of left lower extremities with pain: Secondary | ICD-10-CM

## 2023-01-27 DIAGNOSIS — R208 Other disturbances of skin sensation: Secondary | ICD-10-CM | POA: Diagnosis not present

## 2023-01-27 DIAGNOSIS — L851 Acquired keratosis [keratoderma] palmaris et plantaris: Secondary | ICD-10-CM | POA: Diagnosis not present

## 2023-02-15 DIAGNOSIS — M216X2 Other acquired deformities of left foot: Secondary | ICD-10-CM | POA: Diagnosis not present

## 2023-02-15 DIAGNOSIS — L851 Acquired keratosis [keratoderma] palmaris et plantaris: Secondary | ICD-10-CM | POA: Diagnosis not present

## 2023-03-16 ENCOUNTER — Other Ambulatory Visit: Payer: Self-pay | Admitting: *Deleted

## 2023-03-16 MED ORDER — LORAZEPAM 1 MG PO TABS: ORAL_TABLET | ORAL | 0 refills | Status: AC

## 2023-03-24 ENCOUNTER — Encounter: Payer: Self-pay | Admitting: Surgery

## 2023-03-24 ENCOUNTER — Ambulatory Visit: Payer: Medicare PPO | Admitting: Surgery

## 2023-03-24 VITALS — BP 124/69 | HR 74 | Temp 98.2°F | Resp 16 | Ht 72.0 in | Wt 208.0 lb

## 2023-03-24 DIAGNOSIS — I83812 Varicose veins of left lower extremities with pain: Secondary | ICD-10-CM

## 2023-03-24 DIAGNOSIS — I83892 Varicose veins of left lower extremities with other complications: Secondary | ICD-10-CM

## 2023-03-24 HISTORY — PX: LASER ABLATION: SHX1947

## 2023-03-24 NOTE — Progress Notes (Signed)
     Laser Ablation Procedure    Date: 03/24/2023   RAYYAN BURLEY DOB:1950/03/08  Consent signed: Yes      Surgeon: Jeanella Cara  Procedure: Laser Ablation: left Greater Saphenous Vein   Tumescent Anesthesia: 725cc 0.9% NaCl with 50 cc Lidocaine HCL 1%  and 15 cc 8.4% NaHCO3  Local Anesthesia: 8 cc Lidocaine HCL and NaHCO3 (ratio 2:1)  7 watts continuous mode     Total energy: 743.1 joules    Total time: 106 sec Treatment Length 15 cm  Laser Fiber Ref. #  [id number]      Lot # [id number]   Stab Phlebectomy: >20 Sites: Thigh and Calf  Patient tolerated procedure well  Notes: All staff members wore facial masks and facial shields/goggles.Patient took Ativan 1mg  @ 7:45am.  Description of Procedure: After marking the course of the secondary varicosities, the patient was placed on the operating table in the supine position, and the left leg was prepped and draped in sterile fashion.   Local anesthetic was administered and under ultrasound guidance the saphenous vein was accessed with a micro needle and guide wire; then the mirco puncture sheath was placed.  A guide wire was inserted saphenofemoral junction , followed by a 5 french sheath.  The position of the sheath and then the laser fiber below the junction was confirmed using the ultrasound.  Tumescent anesthesia was administered along the course of the saphenous vein using ultrasound guidance. The patient was placed in Trendelenburg position and protective laser glasses were placed on patient and staff, and the laser was fired at 7 watts continuous mode for a total of 743.1 joules.   For stab phlebectomies, local anesthetic was administered at the previously marked varicosities, and tumescent anesthesia was administered around the vessels.  Greater than 20 stab wounds were made using the tip of an 11 blade. And using the vein hook, the phlebectomies were performed using a hemostat to avulse the varicosities.  Adequate hemostasis was  achieved.    Steri strips were applied to the stab wounds and ABD pads and thigh high compression stockings were applied.  Ace wrap bandages were applied over the phlebectomy sites and at the top of the saphenofemoral junction. Blood loss was less than 15 cc.  Discharge instructions reviewed with patient and hardcopy of discharge instructions given to patient to take home. The patient ambulated out of the operating room having tolerated the procedure well.

## 2023-04-07 ENCOUNTER — Ambulatory Visit (HOSPITAL_COMMUNITY)
Admission: RE | Admit: 2023-04-07 | Discharge: 2023-04-07 | Disposition: A | Discharge status: Home / Self Care | Payer: Medicare PPO | Source: Ambulatory Visit | Attending: Surgery | Admitting: Surgery

## 2023-04-07 ENCOUNTER — Encounter: Payer: Self-pay | Admitting: Surgery

## 2023-04-07 ENCOUNTER — Ambulatory Visit (INDEPENDENT_AMBULATORY_CARE_PROVIDER_SITE_OTHER): Payer: Medicare PPO | Admitting: Surgery

## 2023-04-07 VITALS — BP 136/74 | HR 63 | Temp 98.3°F | Ht 72.0 in | Wt 216.0 lb

## 2023-04-07 DIAGNOSIS — I83812 Varicose veins of left lower extremities with pain: Secondary | ICD-10-CM

## 2023-04-07 NOTE — Progress Notes (Signed)
   Patient name: Jeffrey Browning MRN: [id number] DOB: 1950-11-08 Sex: male  REASON FOR VISIT:     postop  HISTORY OF PRESENT ILLNESS:   Jeffrey Browning is a 73 y.o. 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  On 03/24/2023 he underwent laser ablation followed by greater than 20 stabs.  He is back for follow-up.  His swelling has gone down but he does have some residual swelling in the left leg.  His incisions have healed  CURRENT MEDICATIONS:    Current Outpatient Medications  Medication Sig Dispense Refill   aspirin 81 MG tablet Take 81 mg by mouth daily.       celecoxib (CELEBREX) 200 MG capsule 1 capsule.     doxycycline (ORACEA) 40 MG capsule Take 40 mg by mouth every morning.       furosemide (LASIX) 40 MG tablet Take 40 mg by mouth daily.     LORazepam (ATIVAN) 1 MG tablet Take 1 tablet 30 to 60 minutes prior to leaving house on day of office surgery.  Bring second tablet with you to office on day of office surgery. 2 tablet 0   pravastatin (PRAVACHOL) 40 MG tablet Take 40 mg by mouth daily.       sildenafil (VIAGRA) 100 MG tablet Take by mouth daily as needed.       No current facility-administered medications for this visit.    REVIEW OF SYSTEMS:   [X]  denotes positive finding, [ ]  denotes negative finding Cardiac  Comments:  Chest pain or chest pressure:    Shortness of breath upon exertion:    Short of breath when lying flat:    Irregular heart rhythm:    Constitutional    Fever or chills:      PHYSICAL EXAM:   Vitals:   04/07/23 1027  BP: 136/74  Pulse: 63  Temp: 98.3 F (36.8 C)  SpO2: 98%  Weight: 216 lb (98 kg)  Height: 6' (1.829 m)    GENERAL: The patient is a well-nourished male, in no acute distress. The vital signs are documented above. CARDIOVASCULAR: There is a regular rate and rhythm. PULMONARY: Non-labored respirations Incisions leg is less edematous I evaluated the right great saphenous vein which is markedly  dilated. STUDIES:   Ultrasound shows successful closure of the saphenous vein up to 1.6 cm from the saphenofemoral junction   MEDICAL ISSUES:   Left leg: Successful ablation of the left great saphenous vein.  I discussed with the patient that he has combined deep and superficial venous reflux.  The superficial system has been ablated and so the reason for his residual swelling is his deep vein reflux.  This will likely persist.  Right leg: He is having similar symptoms to the right leg.  He has not had formal evaluation.  I am scheduling him for a venous reflux exam of the right leg within the month and then we will decide whether or not we should proceed with ablation of the right leg.  He would also likely need 2-3 vials of sclerotherapy.  Charlena Cross, MD, FACS [facility] [phone] Pager [phone]

## 2023-04-21 ENCOUNTER — Other Ambulatory Visit: Payer: Self-pay | Admitting: *Deleted

## 2023-04-21 DIAGNOSIS — I872 Venous insufficiency (chronic) (peripheral): Secondary | ICD-10-CM

## 2023-04-25 ENCOUNTER — Encounter: Payer: Self-pay | Admitting: Surgery

## 2023-04-25 ENCOUNTER — Ambulatory Visit (HOSPITAL_COMMUNITY)
Admission: RE | Admit: 2023-04-25 | Discharge: 2023-04-25 | Disposition: A | Discharge status: Home / Self Care | Payer: Medicare PPO | Source: Ambulatory Visit | Attending: Surgery | Admitting: Surgery

## 2023-04-25 ENCOUNTER — Ambulatory Visit: Payer: Medicare PPO | Admitting: Surgery

## 2023-04-25 VITALS — BP 130/80 | HR 65 | Temp 98.1°F | Ht 72.0 in | Wt 215.0 lb

## 2023-04-25 DIAGNOSIS — I83893 Varicose veins of bilateral lower extremities with other complications: Secondary | ICD-10-CM | POA: Diagnosis not present

## 2023-04-25 DIAGNOSIS — I872 Venous insufficiency (chronic) (peripheral): Secondary | ICD-10-CM | POA: Insufficient documentation

## 2023-04-25 NOTE — Progress Notes (Signed)
 [facility]  Patient name: Jeffrey Browning MRN: [id number] DOB: [birth year] Sex: male   REASON FOR VISIT:    Follow up  HISOTRY OF PRESENT ILLNESS:    Jeffrey Browning is a 73 y.o. 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  On 03/24/2023 he underwent laser ablation followed by greater than 20 stabs.  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  He is now worried about his right leg.  He continues to treat this medically with elevation, compression and exercise.  He he had yet to receive a formal venous reflux exam and so that is why he is here today.  PAST MEDICAL HISTORY:   Past Medical History:  Diagnosis Date   Abdominal pain    Abnormal EKG    cardio lite stress test 2006 =normal Dr. Fraser Din   Arthritis    ED (erectile dysfunction)    Hematuria, microscopic    Hernia    Hyperlipidemia    Rosacea    Stenosis, cervical spine    left side tingling     FAMILY HISTORY:   History reviewed. No pertinent family history.  SOCIAL HISTORY:   Social History   Tobacco Use   Smoking status: Never   Smokeless tobacco: Never  Substance Use Topics   Alcohol use: Yes     ALLERGIES:   No Known Allergies   CURRENT MEDICATIONS:   Current Outpatient Medications  Medication Sig Dispense Refill   aspirin 81 MG tablet Take 81 mg by mouth daily.       celecoxib (CELEBREX) 200 MG capsule 1 capsule.     doxycycline (ORACEA) 40 MG capsule Take 40 mg by mouth every morning.       furosemide (LASIX) 40 MG tablet Take 40 mg by mouth daily.     LORazepam (ATIVAN) 1 MG tablet Take 1 tablet 30 to 60 minutes prior to leaving house on day of office surgery.  Bring second tablet with you to office on day of office surgery. 2 tablet 0   pravastatin (PRAVACHOL) 40 MG tablet Take 40 mg by mouth daily.       sildenafil (VIAGRA) 100 MG tablet Take by  mouth daily as needed.       No current facility-administered medications for this visit.    REVIEW OF SYSTEMS:   [X]  denotes positive finding, [ ]  denotes negative finding Cardiac  Comments:  Chest pain or chest pressure:    Shortness of breath upon exertion:    Short of breath when lying flat:    Irregular heart rhythm:        Vascular    Pain in calf, thigh, or hip brought on by ambulation:    Pain in feet at night that wakes you up from your sleep:     Blood clot in your veins:    Leg swelling:  x       Pulmonary    Oxygen at home:    Productive cough:     Wheezing:         Neurologic    Sudden weakness in arms or legs:     Sudden numbness in arms or legs:     Sudden onset of difficulty speaking or slurred speech:    Temporary loss of vision in one eye:     Problems with dizziness:  Gastrointestinal    Blood in stool:     Vomited blood:         Genitourinary    Burning when urinating:     Blood in urine:        Psychiatric    Major depression:         Hematologic    Bleeding problems:    Problems with blood clotting too easily:        Skin    Rashes or ulcers:        Constitutional    Fever or chills:      PHYSICAL EXAM:   Vitals:   04/25/23 1128  BP: 130/80  Pulse: 65  Temp: 98.1 F (36.7 C)  SpO2: 98%  Weight: 215 lb (97.5 kg)  Height: 6' (1.829 m)    GENERAL: The patient is a well-nourished male, in no acute distress. The vital signs are documented above. CARDIAC: There is a regular rate and rhythm.  VASCULAR: Bilateral lower extremity edema PULMONARY: Non-labored respirations MUSCULOSKELETAL: There are no major deformities or cyanosis. NEUROLOGIC: No focal weakness or paresthesias are detected. SKIN: There are no ulcers or rashes noted. PSYCHIATRIC: The patient has a normal affect.  STUDIES:   I have reviewed the following: Venous Reflux Times  +--------------+---------+------+-----------+------------+--------+  RIGHT         Reflux NoRefluxReflux TimeDiameter cmsComments                          Yes                                   +--------------+---------+------+-----------+------------+--------+  CFV                    yes   >1 second                       +--------------+---------+------+-----------+------------+--------+  FV mid        no                                              +--------------+---------+------+-----------+------------+--------+  Popliteal    no                                              +--------------+---------+------+-----------+------------+--------+  GSV at SFJ              yes    >500 ms      .860              +--------------+---------+------+-----------+------------+--------+  GSV prox thigh          yes    >500 ms      .475              +--------------+---------+------+-----------+------------+--------+  GSV mid thigh no                            .446              +--------------+---------+------+-----------+------------+--------+  GSV dist thighno                            .  420              +--------------+---------+------+-----------+------------+--------+  GSV at knee   no                            .394              +--------------+---------+------+-----------+------------+--------+  GSV prox calf no                            .546              +--------------+---------+------+-----------+------------+--------+  GSV mid calf  no                            .343              +--------------+---------+------+-----------+------------+--------+  SSV Pop Fossa no                            .228              +--------------+---------+------+-----------+------------+--------+  SSV prox calf no                            .225              +--------------+---------+------+-----------+------------+--------+    MEDICAL ISSUES:   Right leg does not show a surgically correctable  reflux.  He has reflux at the saphenous vein in the proximal thigh at the saphenofemoral junction however no further reflux.  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  I discussed with him that I do not think that we will achieve the desired results with laser ablation.  I have recommended leg elevation and compression stockings.  He will follow-up with me if he develops a change in his symptoms where we can repeat the reflux study.    Charlena Cross, MD, FACS [facility] [phone] Pager [phone]

## 2023-07-13 DIAGNOSIS — Z23 Encounter for immunization: Secondary | ICD-10-CM | POA: Diagnosis not present

## 2023-07-13 DIAGNOSIS — Z125 Encounter for screening for malignant neoplasm of prostate: Secondary | ICD-10-CM | POA: Diagnosis not present

## 2023-07-13 DIAGNOSIS — I8393 Asymptomatic varicose veins of bilateral lower extremities: Secondary | ICD-10-CM | POA: Diagnosis not present

## 2023-07-13 DIAGNOSIS — M1991 Primary osteoarthritis, unspecified site: Secondary | ICD-10-CM | POA: Diagnosis not present

## 2023-07-13 DIAGNOSIS — E782 Mixed hyperlipidemia: Secondary | ICD-10-CM | POA: Diagnosis not present

## 2023-07-13 DIAGNOSIS — Z0000 Encounter for general adult medical examination without abnormal findings: Secondary | ICD-10-CM | POA: Diagnosis not present

## 2023-07-13 DIAGNOSIS — N529 Male erectile dysfunction, unspecified: Secondary | ICD-10-CM | POA: Diagnosis not present

## 2023-07-13 DIAGNOSIS — Z79899 Other long term (current) drug therapy: Secondary | ICD-10-CM | POA: Diagnosis not present

## 2023-07-13 DIAGNOSIS — Z1331 Encounter for screening for depression: Secondary | ICD-10-CM | POA: Diagnosis not present

## 2023-07-13 DIAGNOSIS — L719 Rosacea, unspecified: Secondary | ICD-10-CM | POA: Diagnosis not present
